# Patient Record
Sex: Male | Born: 1985 | Race: Black or African American | Hispanic: No | Marital: Single | Smoking: Current every day smoker
Health system: Southern US, Community
[De-identification: ages and names within clinical notes are randomized; demographics above are authoritative.]

## PROBLEM LIST (undated history)

## (undated) DIAGNOSIS — E119 Type 2 diabetes mellitus without complications: Secondary | ICD-10-CM

## (undated) DIAGNOSIS — I10 Essential (primary) hypertension: Secondary | ICD-10-CM

## (undated) HISTORY — PX: NO PAST SURGERIES: SHX2092

---

## 2018-04-17 ENCOUNTER — Other Ambulatory Visit: Payer: Self-pay

## 2018-04-17 ENCOUNTER — Emergency Department
Admission: EM | Admit: 2018-04-17 | Discharge: 2018-04-17 | Disposition: A | Discharge status: Home / Self Care | Payer: Self-pay | Attending: Emergency Medicine | Admitting: Emergency Medicine

## 2018-04-17 ENCOUNTER — Encounter: Payer: Self-pay | Admitting: Emergency Medicine

## 2018-04-17 DIAGNOSIS — I10 Essential (primary) hypertension: Secondary | ICD-10-CM | POA: Insufficient documentation

## 2018-04-17 DIAGNOSIS — Z139 Encounter for screening, unspecified: Secondary | ICD-10-CM

## 2018-04-17 DIAGNOSIS — E119 Type 2 diabetes mellitus without complications: Secondary | ICD-10-CM | POA: Insufficient documentation

## 2018-04-17 DIAGNOSIS — Z711 Person with feared health complaint in whom no diagnosis is made: Secondary | ICD-10-CM | POA: Insufficient documentation

## 2018-04-17 DIAGNOSIS — F17200 Nicotine dependence, unspecified, uncomplicated: Secondary | ICD-10-CM | POA: Insufficient documentation

## 2018-04-17 HISTORY — DX: Type 2 diabetes mellitus without complications: E11.9

## 2018-04-17 HISTORY — DX: Essential (primary) hypertension: I10

## 2018-04-17 NOTE — ED Notes (Signed)

## 2018-04-17 NOTE — ED Provider Notes (Signed)
[facility] Emergency Department Provider Note   ____________________________________________   First MD Initiated Contact with Patient 04/17/18 0818     (approximate)  I have reviewed the triage vital signs and the nursing notes.   HISTORY  Chief Complaint Letter for School/Work  Thermodetector at work said temp of 100  HPI Justin Gentry is a 33 y.o. male reports he has been normal and healthy.  He has not had any concerns.  Is been feeling well.  Reports that the he walked into work today, the Engineering geologist at his company which they are using to screen for fever said he had a temperature on the skin of 100  He reports that he can believe that was right, thought they were joking.  They were to hold him that he could not come back to work until cleared.  Reports he needs a note to go back to work and that he feels perfectly fine  He does tell me he was wearing a thick sweater that well insulated, he had the heat on very high in the car as he likes to remain warm it was cold out this morning.  He walked immediately from the car to work where he walked through the Engineering geologist.  Reports they told him it could have been due to that but he needs to have a work note prior due to policy to   He denies any fevers or chills.  No nausea no vomiting.  No cough.  No weakness.  No muscle aches.  No abdominal pain.  Reports he feels perfectly fine no concerns at all  Past Medical History:  Diagnosis Date  . Diabetes mellitus without complication (HCC)   . Hypertension     There are no active problems to display for this patient.   History reviewed. No pertinent surgical history.  Prior to Admission medications   Not on File    Allergies Penicillins  No family history on file.  Social History Social History   Tobacco Use  . Smoking status: Current Every Day Smoker  . Smokeless tobacco: Never Used  Substance Use Topics  . Alcohol use: Not  Currently  . Drug use: Not on file    Review of Systems Constitutional: No fever/chills ENT: No sore throat. Cardiovascular: Denies chest pain. Respiratory: Denies shortness of breath. Gastrointestinal: No abdominal pain.   Genitourinary: Negative for dysuria. Musculoskeletal: No muscle aches. Skin: Negative for rash. Neurological: Negative for headaches or weakness    ____________________________________________   PHYSICAL EXAM:  VITAL SIGNS: ED Triage Vitals  Enc Vitals Group     BP 04/17/18 0814 (!) 153/107     Pulse Rate 04/17/18 0814 89     Resp 04/17/18 0814 18     Temp 04/17/18 0814 98.2 F (36.8 C)     Temp Source 04/17/18 0814 Oral     SpO2 04/17/18 0814 99 %     Weight 04/17/18 0811 (!) 333 lb (151 kg)     Height 04/17/18 0811 6\' 3"  (1.905 m)     Head Circumference --      Peak Flow --      Pain Score 04/17/18 0811 0     Pain Loc --      Pain Edu? --      Excl. in GC? --     Constitutional: Alert and oriented. Well appearing and in no acute distress. Eyes: Conjunctivae are normal. Head: Atraumatic. Nose: No congestion/rhinnorhea. Mouth/Throat: Mucous membranes are moist.  Neck: No stridor.  Cardiovascular: Normal rate, regular rhythm. Grossly normal heart sounds.  Good peripheral circulation. Respiratory: Normal respiratory effort.  No retractions. Lungs CTAB. Gastrointestinal: Soft and nontender.  Musculoskeletal: Normal strength with ambulation. Neurologic:  Normal speech and language. No gross focal neurologic deficits are appreciated.  Skin:  Skin is warm, dry and intact. No rash noted. Psychiatric: Mood and affect are normal. Speech and behavior are normal.  ____________________________________________   LABS (all labs ordered are listed, but only abnormal results are displayed)  Labs Reviewed - No data to display ____________________________________________  EKG   ____________________________________________  RADIOLOGY    ____________________________________________   PROCEDURES  Procedure(s) performed: None  Procedures  Critical Care performed: No  ____________________________________________   INITIAL IMPRESSION / ASSESSMENT AND PLAN / ED COURSE  Pertinent labs & imaging results that were available during my care of the patient were reviewed by me and considered in my medical decision making (see chart for details).   Asymptomatic.  Afebrile here.  Discussed return precautions and signs and symptoms that would alert of a possible fever like muscle aches, cough runny nose, fatigue, weakness difficulty breathing etc. with the patient.  He reports he like to go back to work, will provide a work note stating such as he appears asymptomatic afebrile here.  I suspect this may have been secondary to his having a very thick insulating sweatshirt on and walking immediately into work from his warm vehicle.  Return precautions and treatment recommendations and follow-up discussed with the patient who is agreeable with the plan.       ____________________________________________   FINAL CLINICAL IMPRESSION(S) / ED DIAGNOSES  Final diagnoses:  Encounter for medical screening examination        Note:  This document was prepared using Dragon voice recognition software and may include unintentional dictation errors       Sharyn Creamer, MD 04/17/18 [phone]

## 2018-04-17 NOTE — ED Triage Notes (Signed)
Says his employer sent him because he had a fever detected by an Education administrator at work.  Says he has not been sick or symptomatic.

## 2019-02-22 ENCOUNTER — Other Ambulatory Visit: Payer: Self-pay

## 2019-02-22 ENCOUNTER — Ambulatory Visit
Admission: EM | Admit: 2019-02-22 | Discharge: 2019-02-22 | Disposition: A | Discharge status: Home / Self Care | Payer: Self-pay | Attending: Family Medicine | Admitting: Family Medicine

## 2019-02-22 DIAGNOSIS — A084 Viral intestinal infection, unspecified: Secondary | ICD-10-CM | POA: Insufficient documentation

## 2019-02-22 DIAGNOSIS — R197 Diarrhea, unspecified: Secondary | ICD-10-CM

## 2019-02-22 DIAGNOSIS — R110 Nausea: Secondary | ICD-10-CM

## 2019-02-22 LAB — BASIC METABOLIC PANEL
Anion gap: 6 (ref 5–15)
BUN: 12 mg/dL (ref 6–20)
CO2: 24 mmol/L (ref 22–32)
Calcium: 9.3 mg/dL (ref 8.9–10.3)
Chloride: 104 mmol/L (ref 98–111)
Creatinine, Ser: 1.07 mg/dL (ref 0.61–1.24)
GFR calc Af Amer: >60 mL/min (ref >60)
GFR calc non Af Amer: >60 mL/min (ref >60)
Glucose, Bld: 111 mg/dL — ABNORMAL HIGH (ref 70–99)
Potassium: 4.2 mmol/L (ref 3.5–5.1)
Sodium: 134 mmol/L — ABNORMAL LOW (ref 135–145)

## 2019-02-22 MED ORDER — ONDANSETRON 8 MG PO TBDP: 8.0000 mg | ORAL_TABLET | Freq: Three times a day (TID) | ORAL | 0 refills | Status: DC | PRN

## 2019-02-22 NOTE — ED Provider Notes (Signed)
[facility]    CSN: [id number] Arrival date & time: 02/22/19  1247      History   Chief Complaint Chief Complaint  Patient presents with  . Nausea    HPI Justin Gentry is a 34 y.o. male.   33 yo male with a c/o nausea and diarrhea for the past 6 days and vomiting one day. States he has not vomited for the last 5 days. Denies any fevers, chills, melena, hematochezia. Has been tested twice for covid this week and both times negative. No recent travel, suspicious foods or known sick contacts.      Past Medical History:  Diagnosis Date  . Diabetes mellitus without complication ([place])   . Hypertension     There are no problems to display for this patient.   Past Surgical History:  Procedure Laterality Date  . NO PAST SURGERIES         Home Medications    Prior to Admission medications   Medication Sig Start Date End Date Taking? Authorizing Provider  ondansetron (ZOFRAN ODT) 8 MG disintegrating tablet Take 1 tablet (8 mg total) by mouth every 8 (eight) hours as needed. 02/22/19   Norval Gable, MD    Family History Family History  Problem Relation Age of Onset  . Diabetes Mother   . Hypertension Father     Social History Social History   Tobacco Use  . Smoking status: Current Every Day Smoker    Packs/day: 1.00    Types: Cigarettes  . Smokeless tobacco: Never Used  Substance Use Topics  . Alcohol use: Yes    Comment: occasionally  . Drug use: Yes    Types: Marijuana     Allergies   Penicillins   Review of Systems Review of Systems   Physical Exam Triage Vital Signs ED Triage Vitals  Enc Vitals Group     BP 02/22/19 1308 (!) 161/114     Pulse Rate 02/22/19 1308 71     Resp 02/22/19 1308 16     Temp 02/22/19 1308 98.6 F (37 C)     Temp Source 02/22/19 1308 Oral     SpO2 02/22/19 1308 99 %     Weight 02/22/19 1304 (!) 328 lb (148.8 kg)     Height 02/22/19 1304 6\' 3"  (1.905 m)     Head Circumference --      Peak Flow  --      Pain Score 02/22/19 1304 6     Pain Loc --      Pain Edu? --      Excl. in [place]? --    No data found.  Updated Vital Signs BP (!) 161/114 (BP Location: Left Arm)   Pulse 71   Temp 98.6 F (37 C) (Oral)   Resp 16   Ht 6\' 3"  (1.905 m)   Wt (!) 148.8 kg   SpO2 99%   BMI 41.00 kg/m   Visual Acuity Right Eye Distance:   Left Eye Distance:   Bilateral Distance:    Right Eye Near:   Left Eye Near:    Bilateral Near:     Physical Exam Vitals and nursing note reviewed.  Constitutional:      General: He is not in acute distress.    Appearance: He is not toxic-appearing or diaphoretic.  Cardiovascular:     Rate and Rhythm: Normal rate.  Pulmonary:     Effort: Pulmonary effort is normal. No respiratory distress.  Abdominal:  General: Bowel sounds are normal. There is no distension.     Palpations: Abdomen is soft. There is no mass.     Tenderness: There is abdominal tenderness (mild; diffuse; no rebound or guarding). There is no right CVA tenderness, left CVA tenderness, guarding or rebound.     Hernia: No hernia is present.  Neurological:     Mental Status: He is alert.      UC Treatments / Results  Labs (all labs ordered are listed, but only abnormal results are displayed) Labs Reviewed  BASIC METABOLIC PANEL - Abnormal; Notable for the following components:      Result Value   Sodium 134 (*)    Glucose, Bld 111 (*)    All other components within normal limits    EKG   Radiology No results found.  Procedures Procedures (including critical care time)  Medications Ordered in UC Medications - No data to display  Initial Impression / Assessment and Plan / UC Course  I have reviewed the triage vital signs and the nursing notes.  Pertinent labs & imaging results that were available during my care of the patient were reviewed by me and considered in my medical decision making (see chart for details).      Final Clinical Impressions(s) / UC  Diagnoses   Final diagnoses:  Viral gastroenteritis     Discharge Instructions     Clear liquids, then advance diet slowly as tolerated Over the counter Imodium AD    ED Prescriptions    Medication Sig Dispense Auth. Provider   ondansetron (ZOFRAN ODT) 8 MG disintegrating tablet Take 1 tablet (8 mg total) by mouth every 8 (eight) hours as needed. 6 tablet Payton Mccallum, MD      1. Lab results and diagnosis reviewed with patient 2. rx as per orders above; reviewed possible side effects, interactions, risks and benefits  3. Recommend supportive treatment as above 4. Follow-up prn if symptoms worsen or don't improve   PDMP not reviewed this encounter.   Payton Mccallum, MD 02/22/19 [phone]

## 2019-02-22 NOTE — ED Triage Notes (Signed)
Patient states that he has a cough, nausea and vomiting and diarrhea with headaches x monday. Patient states that he was swabbed at [facility] on Tuesday last week and Friday (2 days ago) for covid and was negative.

## 2019-02-22 NOTE — Discharge Instructions (Signed)
Clear liquids, then advance diet slowly as tolerated Over the counter Imodium AD

## 2020-06-13 ENCOUNTER — Ambulatory Visit: Admit: 2020-06-13 | Payer: Self-pay

## 2020-06-13 ENCOUNTER — Other Ambulatory Visit: Payer: Self-pay

## 2020-06-13 ENCOUNTER — Encounter: Payer: Self-pay | Admitting: Emergency Medicine

## 2020-06-13 ENCOUNTER — Ambulatory Visit: Payer: Self-pay

## 2020-06-13 ENCOUNTER — Ambulatory Visit
Admission: EM | Admit: 2020-06-13 | Discharge: 2020-06-13 | Disposition: A | Discharge status: Home / Self Care | Payer: Self-pay | Attending: Internal Medicine | Admitting: Internal Medicine

## 2020-06-13 DIAGNOSIS — K1120 Sialoadenitis, unspecified: Secondary | ICD-10-CM

## 2020-06-13 MED ORDER — MOXIFLOXACIN HCL 400 MG PO TABS: 400.0000 mg | ORAL_TABLET | Freq: Every day | ORAL | 0 refills | Status: DC

## 2020-06-13 NOTE — ED Triage Notes (Signed)
Patient c/o right side jaw pain that started on Friday. Denies any other symptoms.

## 2020-06-13 NOTE — ED Provider Notes (Signed)
[facility]    CSN: [id number] Arrival date & time: 06/13/20  1732      History   Chief Complaint Chief Complaint  Patient presents with  . Jaw Pain    HPI Justin Gentry is a 35 y.o. male who presents with R jaw pain and swelling x 3 days. States that eating makes it worse. Denies ear pain, throat pain or dental problems.     Past Medical History:  Diagnosis Date  . Diabetes mellitus without complication (HCC)   . Hypertension     There are no problems to display for this patient.   Past Surgical History:  Procedure Laterality Date  . NO PAST SURGERIES         Home Medications    Prior to Admission medications   Medication Sig Start Date End Date Taking? Authorizing Provider  moxifloxacin (AVELOX) 400 MG tablet Take 1 tablet (400 mg total) by mouth daily at 8 pm. 06/13/20  Yes Rodriguez-Southworth, Nettie Elm, PA-C    Family History Family History  Problem Relation Age of Onset  . Diabetes Mother   . Hypertension Father     Social History Social History   Tobacco Use  . Smoking status: Current Every Day Smoker    Packs/day: 1.00    Types: Cigarettes  . Smokeless tobacco: Never Used  Vaping Use  . Vaping Use: Never used  Substance Use Topics  . Alcohol use: Yes    Comment: occasionally  . Drug use: Yes    Types: Marijuana     Allergies   Penicillins   Review of Systems Review of Systems  Constitutional: Negative for chills, diaphoresis and fever.  HENT: Negative for congestion, dental problem, ear discharge, ear pain, sore throat, tinnitus and trouble swallowing.        + R jaw pain  Respiratory: Negative for cough.   Musculoskeletal: Negative for myalgias.  Skin: Negative for color change, pallor, rash and wound.  Neurological: Negative for headaches.  Hematological: Negative for adenopathy.     Physical Exam Triage Vital Signs ED Triage Vitals  Enc Vitals Group     BP 06/13/20 1811 (!) 155/89     Pulse Rate  06/13/20 1811 80     Resp 06/13/20 1811 18     Temp 06/13/20 1811 98.4 F (36.9 C)     Temp Source 06/13/20 1811 Oral     SpO2 06/13/20 1811 100 %     Weight 06/13/20 1810 (!) 333 lb (151 kg)     Height 06/13/20 1810 6\' 3"  (1.905 m)     Head Circumference --      Peak Flow --      Pain Score 06/13/20 1810 6     Pain Loc --      Pain Edu? --      Excl. in GC? --    No data found.  Updated Vital Signs BP (!) 155/89 (BP Location: Right Arm)   Pulse 80   Temp 98.4 F (36.9 C) (Oral)   Resp 18   Ht 6\' 3"  (1.905 m)   Wt (!) 333 lb (151 kg)   SpO2 100%   BMI 41.62 kg/m   Visual Acuity Right Eye Distance:   Left Eye Distance:   Bilateral Distance:    Right Eye Near:   Left Eye Near:    Bilateral Near:     Physical Exam Vitals and nursing note reviewed.  Constitutional:      General: He is not  in acute distress.    Appearance: He is obese. He is not toxic-appearing.  HENT:     Head: Normocephalic.     Right Ear: Tympanic membrane, ear canal and external ear normal.     Left Ear: Tympanic membrane, ear canal and external ear normal.     Mouth/Throat:     Mouth: Mucous membranes are moist.     Pharynx: Oropharynx is clear.     Comments: Teeth are in good shape, gums are intact  R Jaw with quarter size soft mass which is tender right at the salivary gland region.  Eyes:     General: No scleral icterus.    Conjunctiva/sclera: Conjunctivae normal.  Pulmonary:     Effort: Pulmonary effort is normal.  Musculoskeletal:        General: Normal range of motion.     Cervical back: Neck supple.  Skin:    General: Skin is warm and dry.     Findings: No rash.  Neurological:     Mental Status: He is alert and oriented to person, place, and time.     Gait: Gait normal.  Psychiatric:        Mood and Affect: Mood normal.        Behavior: Behavior normal.        Thought Content: Thought content normal.        Judgment: Judgment normal.      UC Treatments / Results   Labs (all labs ordered are listed, but only abnormal results are displayed) Labs Reviewed - No data to display  EKG   Radiology No results found.  Procedures Procedures (including critical care time)  Medications Ordered in UC Medications - No data to display  Initial Impression / Assessment and Plan / UC Course  I have reviewed the triage vital signs and the nursing notes. Has R salivary gland infection. I looked up in uptodate for tx since he is allergic to [facility] and I placed him on avelox 400 mg qd x 7 days.    Final Clinical Impressions(s) / UC Diagnoses   Final diagnoses:  Salivary gland infection   Discharge Instructions   None    ED Prescriptions    Medication Sig Dispense Auth. Provider   moxifloxacin (AVELOX) 400 MG tablet Take 1 tablet (400 mg total) by mouth daily at 8 pm. 7 tablet Rodriguez-Southworth, Nettie Elm, PA-C     PDMP not reviewed this encounter.   Garey Ham, [place] 06/13/20 [id number]

## 2020-06-29 ENCOUNTER — Ambulatory Visit (INDEPENDENT_AMBULATORY_CARE_PROVIDER_SITE_OTHER): Payer: Self-pay

## 2020-06-29 ENCOUNTER — Ambulatory Visit
Admission: EM | Admit: 2020-06-29 | Discharge: 2020-06-29 | Disposition: A | Discharge status: Home / Self Care | Payer: Self-pay

## 2020-06-29 ENCOUNTER — Other Ambulatory Visit: Payer: Self-pay

## 2020-06-29 DIAGNOSIS — S161XXA Strain of muscle, fascia and tendon at neck level, initial encounter: Secondary | ICD-10-CM

## 2020-06-29 DIAGNOSIS — R202 Paresthesia of skin: Secondary | ICD-10-CM

## 2020-06-29 DIAGNOSIS — S29019A Strain of muscle and tendon of unspecified wall of thorax, initial encounter: Secondary | ICD-10-CM

## 2020-06-29 DIAGNOSIS — M542 Cervicalgia: Secondary | ICD-10-CM

## 2020-06-29 DIAGNOSIS — M546 Pain in thoracic spine: Secondary | ICD-10-CM

## 2020-06-29 MED ORDER — DICLOFENAC SODIUM 1 % EX GEL: 4.0000 g | Freq: Four times a day (QID) | CUTANEOUS | 0 refills | Status: AC

## 2020-06-29 MED ORDER — CYCLOBENZAPRINE HCL 10 MG PO TABS: 10.0000 mg | ORAL_TABLET | Freq: Three times a day (TID) | ORAL | 0 refills | Status: AC
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
## 2020-06-29 NOTE — Discharge Instructions (Addendum)
  Please follow with your primary care doctor or orthopedics if you dont start getting better in 48-72 hours and also to be referred to physical therapy if your insurance required a referral to them. If not you can go to  [facility]  Address: [address], [place], [place] [address] Hours:  Wednesday 7:30AM-6:30PM Thursday 7:30AM-6:30PM Friday 7:30AM-12PM Saturday Closed Sunday Closed Monday 7:30AM-6:30PM Tuesday 7:30AM-6:30PM Confirmed by this business 2 weeks ago Suggest new hours  Phone: [phone]
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
## 2020-09-29 ENCOUNTER — Encounter: Payer: Self-pay | Admitting: Emergency Medicine

## 2020-09-29 ENCOUNTER — Ambulatory Visit
Admission: EM | Admit: 2020-09-29 | Discharge: 2020-09-29 | Disposition: A | Discharge status: Home / Self Care | Payer: Self-pay | Attending: Physician Assistant | Admitting: Physician Assistant

## 2020-09-29 ENCOUNTER — Ambulatory Visit (INDEPENDENT_AMBULATORY_CARE_PROVIDER_SITE_OTHER): Payer: Self-pay

## 2020-09-29 ENCOUNTER — Other Ambulatory Visit: Payer: Self-pay

## 2020-09-29 DIAGNOSIS — L089 Local infection of the skin and subcutaneous tissue, unspecified: Secondary | ICD-10-CM

## 2020-09-29 DIAGNOSIS — M79671 Pain in right foot: Secondary | ICD-10-CM

## 2020-09-29 DIAGNOSIS — B353 Tinea pedis: Secondary | ICD-10-CM

## 2020-09-29 DIAGNOSIS — E11628 Type 2 diabetes mellitus with other skin complications: Secondary | ICD-10-CM

## 2020-09-29 DIAGNOSIS — M87876 Other osteonecrosis, unspecified foot: Secondary | ICD-10-CM

## 2020-09-29 MED ORDER — HYDROCODONE-ACETAMINOPHEN 5-325 MG PO TABS: 1.0000 | ORAL_TABLET | Freq: Four times a day (QID) | ORAL | 0 refills | Status: AC | PRN

## 2020-09-29 MED ORDER — LEVOFLOXACIN 750 MG PO TABS: 750.0000 mg | ORAL_TABLET | Freq: Every day | ORAL | 0 refills | Status: AC

## 2020-09-29 NOTE — ED Triage Notes (Signed)
Pt c/o right foot pain. Pain is located on the bottom of the foot. He states he has a burning sensation. Started "a while ago" but states it has gotten worse in the last 2 days. He can not walk as of yesterday.

## 2020-09-29 NOTE — ED Provider Notes (Signed)
[facility]    CSN: [id number] Arrival date & time: 09/29/20  0813      History   Chief Complaint Chief Complaint  Patient presents with   Foot Pain    right    HPI Justin Gentry is a 35 y.o. male presenting for right foot pain since the beginning of the year which has been worse over the past few days and especially today.  He has had some swelling and pain at the plantar fourth metatarsal region for this long.  His wife says he had a hole in this area she noticed yesterday but has closed up now and there is some surrounding dark skin.  There was no drainage.  He denies ever having any sort of injury.  He has taken over-the-counter pain medications.  Admits to significant pain on bearing weight and says he cannot.  Additionally he does report pain of the metatarsal area and over the navicular bone for as long as he can remember.  He says that pain is not as bad.  He does have flat feet as well.  He has never been seen for this.  Patient has a history of diabetes and hypertension.  He has no other complaints or concerns.  HPI  Past Medical History:  Diagnosis Date   Diabetes mellitus without complication (HCC)    Hypertension     There are no problems to display for this patient.   Past Surgical History:  Procedure Laterality Date   NO PAST SURGERIES         Home Medications    Prior to Admission medications   Medication Sig Start Date End Date Taking? Authorizing Provider  amlodipine-olmesartan (AZOR) 10-20 MG tablet Take 1 tablet by mouth daily. 06/03/20  Yes [provider]  HYDROcodone-acetaminophen (NORCO/VICODIN) 5-325 MG tablet Take 1 tablet by mouth every 6 (six) hours as needed for up to 5 days. 09/29/20 10/04/20 Yes Shirlee Latch, PA-C  levofloxacin (LEVAQUIN) 750 MG tablet Take 1 tablet (750 mg total) by mouth daily for 7 days. 09/29/20 10/06/20 Yes Shirlee Latch, PA-C  cyclobenzaprine (FLEXERIL) 10 MG tablet Take 1 tablet (10 mg total)  by mouth 3 (three) times daily. 06/29/20   Rodriguez-Southworth, Nettie Elm, PA-C  diclofenac Sodium (VOLTAREN) 1 % GEL Apply 4 g topically 4 (four) times daily. 06/29/20   Rodriguez-Southworth, Nettie Elm, PA-C    Family History Family History  Problem Relation Age of Onset   Diabetes Mother    Hypertension Father     Social History Social History   Tobacco Use   Smoking status: Every Day    Packs/day: 1.00    Types: Cigarettes   Smokeless tobacco: Never  Vaping Use   Vaping Use: Never used  Substance Use Topics   Alcohol use: Yes    Comment: occasionally   Drug use: Yes    Types: Marijuana     Allergies   Penicillins   Review of Systems Review of Systems  Constitutional:  Negative for fatigue and fever.  Musculoskeletal:  Positive for arthralgias, gait problem and joint swelling.  Skin:  Positive for color change and wound.  Neurological:  Negative for weakness and numbness.    Physical Exam Triage Vital Signs ED Triage Vitals  Enc Vitals Group     BP 09/29/20 0828 (!) 152/103     Pulse Rate 09/29/20 0828 78     Resp 09/29/20 0828 18     Temp 09/29/20 0828 98 F (36.7 C)  Temp Source 09/29/20 0828 Oral     SpO2 09/29/20 0828 96 %     Weight 09/29/20 0826 (!) 343 lb 0.6 oz (155.6 kg)     Height 09/29/20 0826 6\' 3"  (1.905 m)     Head Circumference --      Peak Flow --      Pain Score 09/29/20 0826 10     Pain Loc --      Pain Edu? --      Excl. in GC? --    No data found.  Updated Vital Signs BP (!) 152/103 (BP Location: Left Arm)   Pulse 78   Temp 98 F (36.7 C) (Oral)   Resp 18   Ht 6\' 3"  (1.905 m)   Wt (!) 343 lb 0.6 oz (155.6 kg)   SpO2 96%   BMI 42.88 kg/m      Physical Exam Vitals and nursing note reviewed.  Constitutional:      General: He is not in acute distress.    Appearance: Normal appearance. He is well-developed. He is obese. He is not ill-appearing.  HENT:     Head: Normocephalic and atraumatic.  Eyes:     General: No scleral  icterus.    Conjunctiva/sclera: Conjunctivae normal.  Cardiovascular:     Rate and Rhythm: Normal rate and regular rhythm.  Pulmonary:     Effort: Pulmonary effort is normal. No respiratory distress.     Breath sounds: Normal breath sounds.  Musculoskeletal:     Cervical back: Neck supple.     Comments: RIGHT FOOT: See images below.  Patient has area of swelling and yellowing of skin about the fourth plantar metatarsal head.  There is a grayish skin in this area as well.  Area is significantly tender to palpation.  No drainage.  Additionally he does have macerated skin between the third and fourth and fourth and fifth digits consistent with tinea pedis.  No pain or swelling of any other part of the foot.  Skin:    General: Skin is warm and dry.  Neurological:     General: No focal deficit present.     Mental Status: He is alert. Mental status is at baseline.     Motor: No weakness.     Coordination: Coordination normal.     Gait: Gait abnormal.  Psychiatric:        Mood and Affect: Mood normal.        Behavior: Behavior normal.        Thought Content: Thought content normal.        UC Treatments / Results  Labs (all labs ordered are listed, but only abnormal results are displayed) Labs Reviewed - No data to display  EKG   Radiology DG Foot Complete Right  Result Date: 09/29/2020 CLINICAL DATA:  Right foot pain. EXAM: RIGHT FOOT COMPLETE - 3+ VIEW COMPARISON:  None. FINDINGS: The joint spaces are maintained. No acute fracture is identified. Moderate sclerotic change involving the navicular bone along with cortical irregularity on the lateral film suggesting spontaneous osteonecrosis Weiss syndrome). IMPRESSION: 1. No acute bony findings. 2. Navicular osteonecrosis (Mueller Weiss syndrome). Electronically Signed   By: 11/29/2020 M.D.   On: 09/29/2020 09:00    Procedures Procedures (including critical care time)  Medications Ordered in UC Medications - No data to  display  Initial Impression / Assessment and Plan / UC Course  I have reviewed the triage vital signs and the nursing notes.  Pertinent labs &  imaging results that were available during my care of the patient were reviewed by me and considered in my medical decision making (see chart for details).  35 year old male presenting with wife for right foot pain for several months that has worsened over the last couple days and especially today.  Admits to being unable to bear weight due to the pain.  On exam he does have swelling and discolored skin of the plantar surface of the foot about the fourth metatarsal head.  Additionally he has evidence of tinea pedis.  X-ray of foot obtained today notes Mueller Weiss syndrome, osteonecrosis of navicular bone.  Patient says he does have pain off and on in this area and has had it for as long as he can remember.  Also admits to occasional arch pain and flatfoot.  Will refer patient to podiatry for this.  There is some concern for osteonecrosis of the other region where he is having pain.  It may just not be visible on simple x-ray.  Concern for developing and extending infection and given his history of diabetes, we will cover him with Levaquin.  I have placed a referral to podiatry for patient and placed him in a CAM boot.  Also provided with a short supply of Norco after reviewing controlled substance database.  Reviewed ED precautions.   Final Clinical Impressions(s) / UC Diagnoses   Final diagnoses:  Right foot pain  Muller-Weiss disease (HCC)  Tinea pedis of right foot  Diabetic foot infection [facility])     Discharge Instructions      -Your x-ray does not show any abnormality in the area you are having pain and swelling, but I do have suspicion for possible underlying osteonecrosis given the fact that radiologist identified you have a condition called Mueller Weiss syndrome.  This is where part of your navicular bone spontaneously degrades.  I am  concerned you may be having the same thing occur in the area you are having pain now.  I have placed a referral to podiatry.  We have given you a cam walking boot.  Use this.  I have also given you a work note for seated work only.  If you do not hear from podiatry in a week, please contact the number on the referral.  I have sent some for pain relief as well.  If you develop a fever, increased pain or develop a large open area you should go to the emergency department if you are still waiting on the podiatry referral where an emergency MRI may be ordered. -Concern for developing abscess so I have sent antibiotics  -You also have athlete's foot.  See handout about this.  Purchase over-the-counter Lamisil.     ED Prescriptions     Medication Sig Dispense Auth. Provider   HYDROcodone-acetaminophen (NORCO/VICODIN) 5-325 MG tablet Take 1 tablet by mouth every 6 (six) hours as needed for up to 5 days. 15 tablet Eusebio Friendly B, PA-C   levofloxacin (LEVAQUIN) 750 MG tablet Take 1 tablet (750 mg total) by mouth daily for 7 days. 7 tablet Shirlee Latch, PA-C      I have reviewed the PDMP during this encounter.   Eusebio Friendly B, PA-C 09/29/20 1000

## 2020-09-29 NOTE — Discharge Instructions (Addendum)
-  Your x-ray does not show any abnormality in the area you are having pain and swelling, but I do have suspicion for possible underlying osteonecrosis given the fact that radiologist identified you have a condition called Mueller Weiss syndrome.  This is where part of your navicular bone spontaneously degrades.  I am concerned you may be having the same thing occur in the area you are having pain now.  I have placed a referral to podiatry.  We have given you a cam walking boot.  Use this.  I have also given you a work note for seated work only.  If you do not hear from podiatry in a week, please contact the number on the referral.  I have sent some for pain relief as well.  If you develop a fever, increased pain or develop a large open area you should go to the emergency department if you are still waiting on the podiatry referral where an emergency MRI may be ordered. -Concern for developing abscess so I have sent antibiotics  -You also have athlete's foot.  See handout about this.  Purchase over-the-counter Lamisil.
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
## 2022-06-08 IMAGING — CR DG FOOT COMPLETE 3+V*R*
3 series · 3 of 3 positions shown · non-contrast
Comparison: None.

CLINICAL DATA: Right foot pain.

EXAM:
RIGHT FOOT COMPLETE - 3+ VIEW

[foot ap]
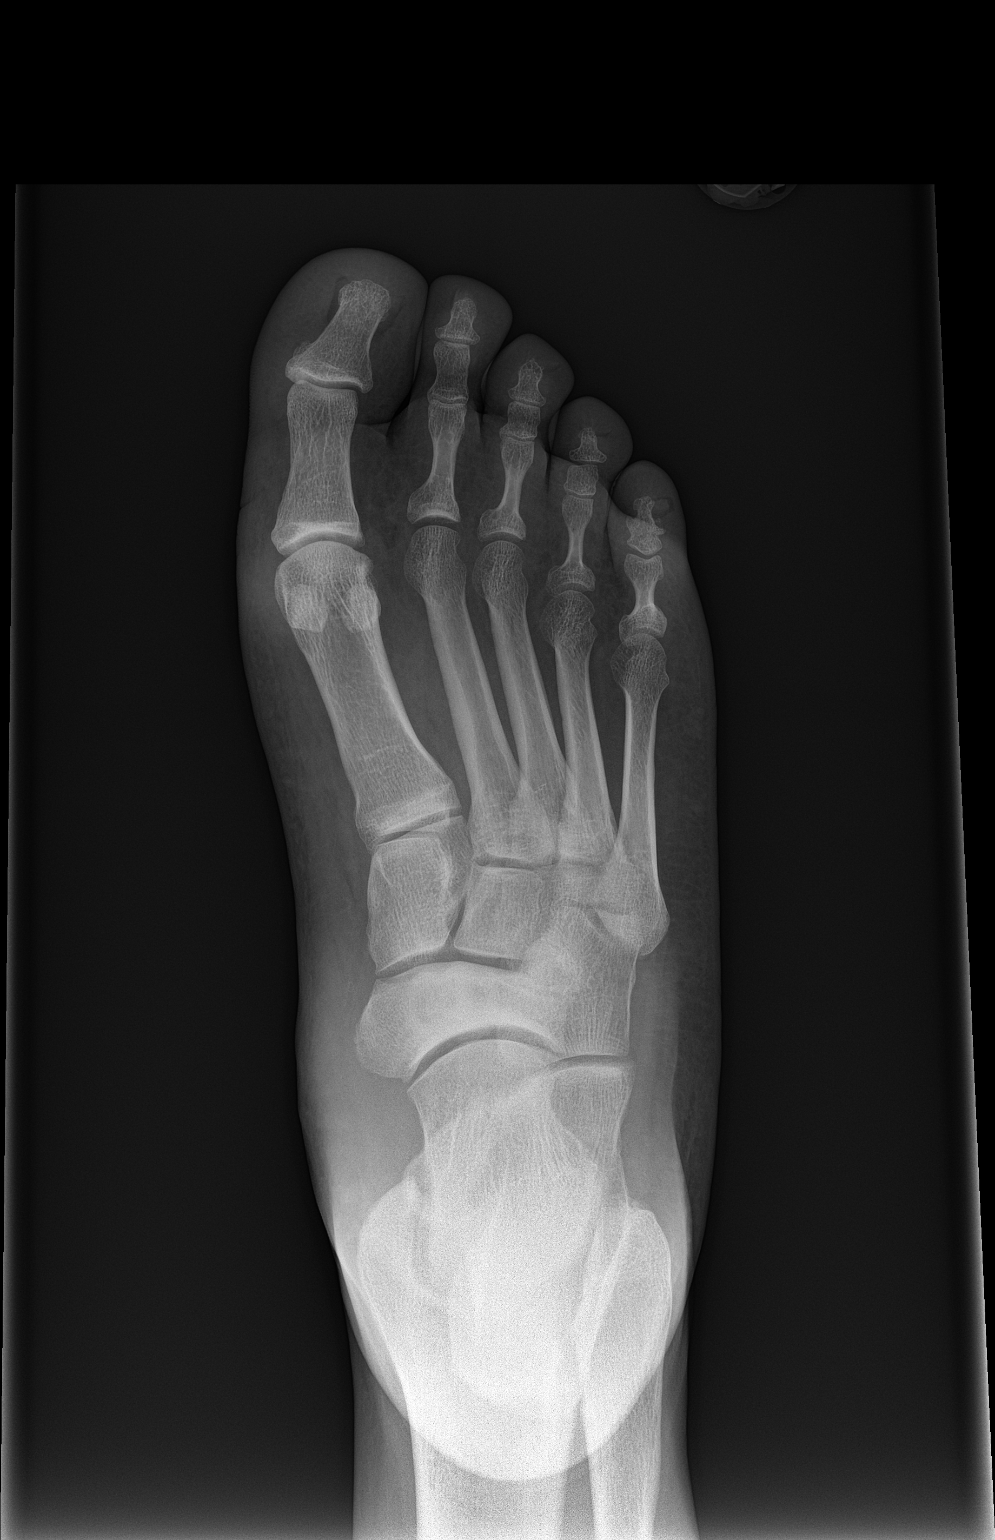

[foot obl]
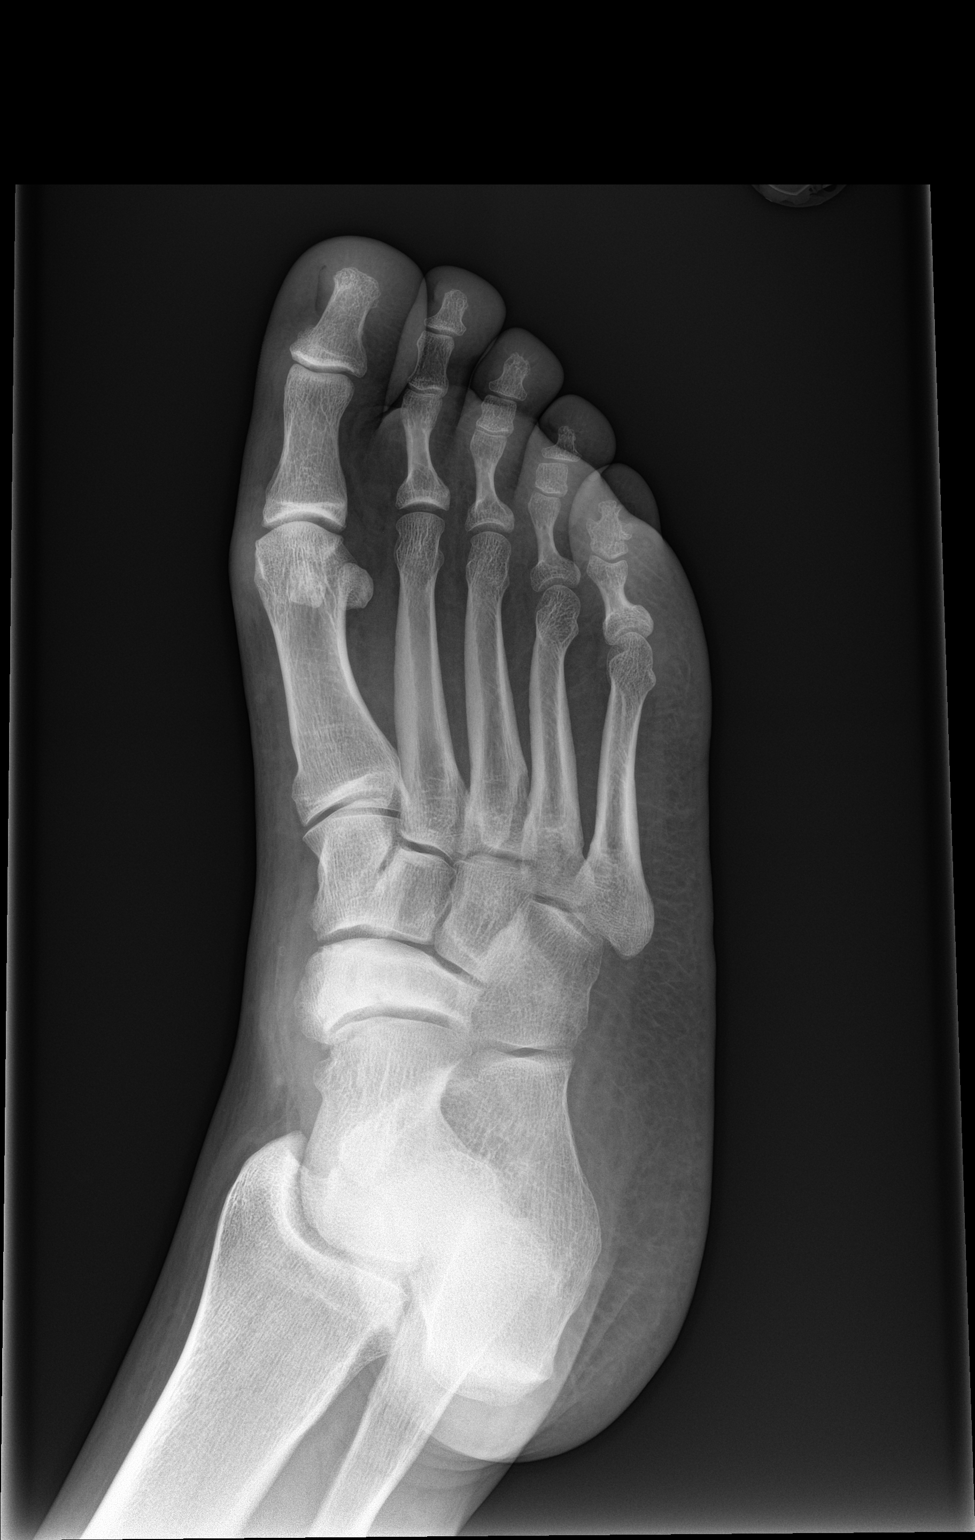

[foot lat]
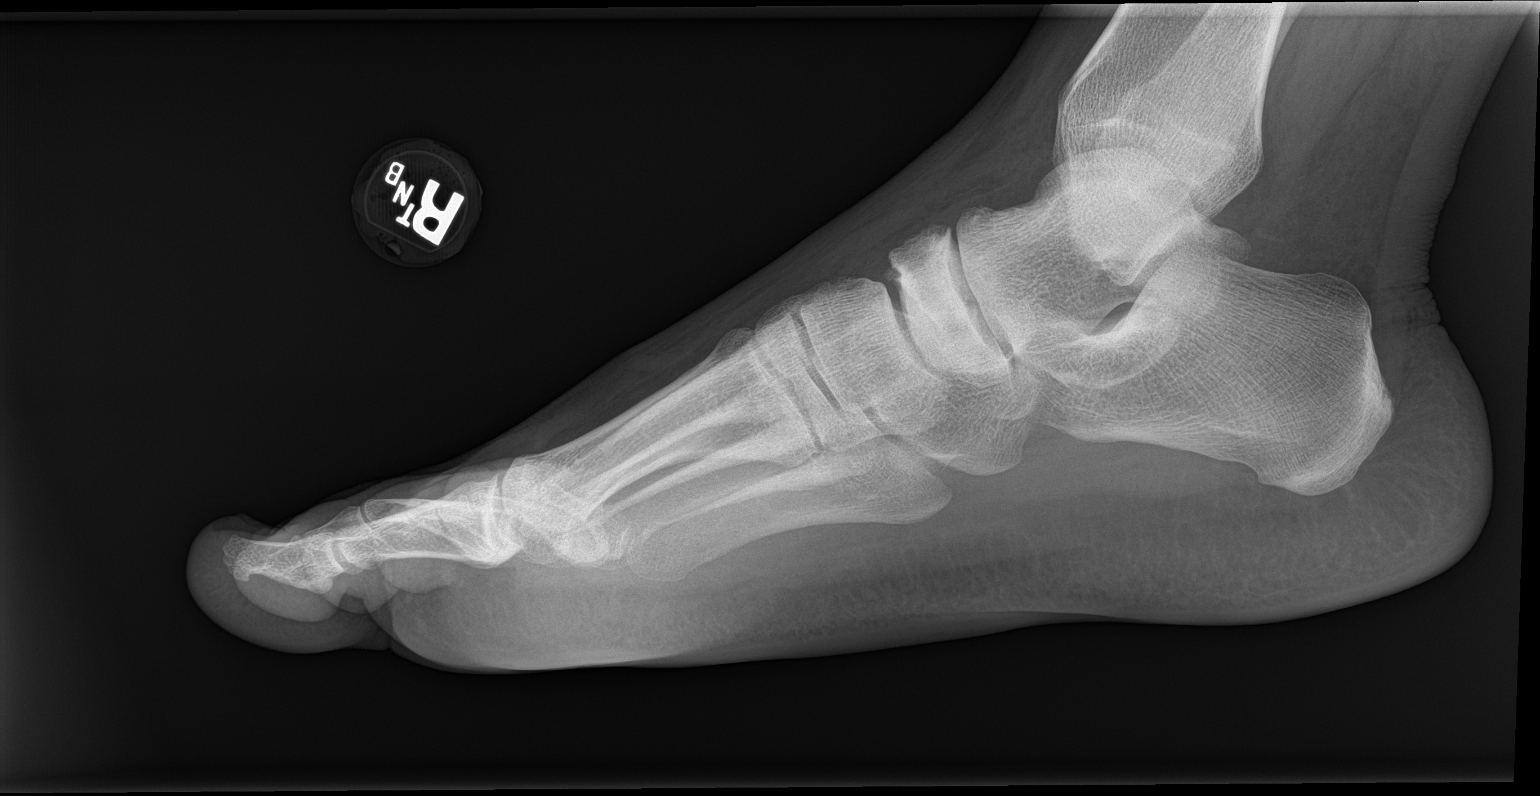

[3 of 3 positions shown; findings below may reference images not displayed]

FINDINGS: The joint spaces are maintained. No acute fracture is identified.

Moderate sclerotic change involving the navicular bone along with
cortical irregularity on the lateral film suggesting spontaneous
osteonecrosis (Auntyjatty Delowr syndrome).
IMPRESSION: 1. No acute bony findings.
2. Navicular osteonecrosis (Auntyjatty Delowr syndrome).
